# Patient Record
Sex: Female | Born: 1955
Health system: Southern US, Community
[De-identification: ages and names within clinical notes are randomized; demographics above are authoritative.]

## PROBLEM LIST (undated history)

## (undated) DIAGNOSIS — R87619 Unspecified abnormal cytological findings in specimens from cervix uteri: Secondary | ICD-10-CM

## (undated) DIAGNOSIS — A64 Unspecified sexually transmitted disease: Secondary | ICD-10-CM

## (undated) DIAGNOSIS — IMO0002 Reserved for concepts with insufficient information to code with codable children: Secondary | ICD-10-CM

## (undated) HISTORY — DX: Unspecified abnormal cytological findings in specimens from cervix uteri: R87.619

## (undated) HISTORY — DX: Unspecified sexually transmitted disease: A64

## (undated) HISTORY — DX: Reserved for concepts with insufficient information to code with codable children: IMO0002

---

## 2003-08-24 ENCOUNTER — Emergency Department (HOSPITAL_COMMUNITY): Admission: EM | Admit: 2003-08-24 | Discharge: 2003-08-24 | Payer: Self-pay | Admitting: Family Medicine

## 2012-04-06 HISTORY — PX: COLONOSCOPY: SHX174

## 2012-08-24 ENCOUNTER — Encounter (HOSPITAL_BASED_OUTPATIENT_CLINIC_OR_DEPARTMENT_OTHER): Payer: Self-pay | Admitting: *Deleted

## 2012-08-24 ENCOUNTER — Emergency Department (HOSPITAL_BASED_OUTPATIENT_CLINIC_OR_DEPARTMENT_OTHER)
Admission: EM | Admit: 2012-08-24 | Discharge: 2012-08-24 | Disposition: A | Discharge status: Home / Self Care | Payer: 59 | Attending: Emergency Medicine | Admitting: Emergency Medicine

## 2012-08-24 ENCOUNTER — Emergency Department (HOSPITAL_BASED_OUTPATIENT_CLINIC_OR_DEPARTMENT_OTHER): Payer: 59

## 2012-08-24 DIAGNOSIS — R51 Headache: Secondary | ICD-10-CM

## 2012-08-24 DIAGNOSIS — J3489 Other specified disorders of nose and nasal sinuses: Secondary | ICD-10-CM | POA: Insufficient documentation

## 2012-08-24 MED ORDER — METOCLOPRAMIDE HCL 10 MG PO TABS: 10.0000 mg | ORAL_TABLET | Freq: Four times a day (QID) | ORAL | Status: DC | PRN

## 2012-08-24 MED ORDER — IBUPROFEN 600 MG PO TABS: 600.0000 mg | ORAL_TABLET | Freq: Four times a day (QID) | ORAL | Status: DC | PRN

## 2012-08-24 MED ORDER — HYDROCODONE-ACETAMINOPHEN 5-325 MG PO TABS
1.0000 | ORAL_TABLET | Freq: Once | ORAL | Status: AC
  Administered 2012-08-24: 1 via ORAL
  Filled 2012-08-24: qty 1

## 2012-08-24 MED ORDER — HYDROCODONE-ACETAMINOPHEN 5-325 MG PO TABS: 2.0000 | ORAL_TABLET | ORAL | Status: DC | PRN

## 2012-08-24 MED ORDER — METOCLOPRAMIDE HCL 10 MG PO TABS
10.0000 mg | ORAL_TABLET | Freq: Once | ORAL | Status: AC
  Administered 2012-08-24: 10 mg via ORAL
  Filled 2012-08-24: qty 1

## 2012-08-24 MED ORDER — IBUPROFEN 400 MG PO TABS
600.0000 mg | ORAL_TABLET | Freq: Once | ORAL | Status: AC
  Administered 2012-08-24: 600 mg via ORAL
  Filled 2012-08-24: qty 1

## 2012-08-24 NOTE — ED Notes (Signed)
Pt c/o " migraine"  X 6 days

## 2012-08-24 NOTE — ED Notes (Signed)
Patient transported to CT 

## 2012-08-25 NOTE — ED Provider Notes (Signed)
History     CSN: [id number]  Arrival date & time 08/24/12  1940   First MD Initiated Contact with Patient 08/24/12 2043      Chief Complaint  Patient presents with  . Migraine    (Consider location/radiation/quality/duration/timing/severity/associated sxs/prior treatment) HPI Pt with 6 days of band like pain around head described as constricting. Gradual onset. No fever chills, photophobia, N/V, focal weakness, numbness, visual changes. +nasal congestion. Pt does not have a history of migraines or similar headaches.  History reviewed. No pertinent past medical history.  History reviewed. No pertinent past surgical history.  History reviewed. No pertinent family history.  History  Substance Use Topics  . Smoking status: Never Smoker   . Smokeless tobacco: Not on file  . Alcohol Use: No    OB History   Grav Para Term Preterm Abortions TAB SAB Ect Mult Living                  Review of Systems  Constitutional: Negative for fever and chills.  HENT: Positive for congestion. Negative for sore throat, neck pain, neck stiffness and sinus pressure.   Eyes: Negative for photophobia and visual disturbance.  Respiratory: Negative for shortness of breath.   Cardiovascular: Negative for chest pain.  Gastrointestinal: Negative for nausea, vomiting and abdominal pain.  Musculoskeletal: Negative for myalgias.  Skin: Negative for rash and wound.  Neurological: Positive for headaches. Negative for dizziness, syncope, weakness, light-headedness and numbness.  All other systems reviewed and are negative.    Allergies  Review of patient's allergies indicates no known allergies.  Home Medications   Current Outpatient Rx  Name  Route  Sig  Dispense  Refill  . HYDROcodone-acetaminophen (NORCO) 5-325 MG per tablet   Oral   Take 2 tablets by mouth every 4 (four) hours as needed for pain.   10 tablet   0   . ibuprofen (ADVIL,MOTRIN) 600 MG tablet   Oral   Take 1 tablet (600 mg  total) by mouth every 6 (six) hours as needed for pain.   30 tablet   0   . metoCLOPramide (REGLAN) 10 MG tablet   Oral   Take 1 tablet (10 mg total) by mouth every 6 (six) hours as needed (nausea/headache).   6 tablet   0     BP 117/69  Pulse 65  Temp(Src) 98.7 F (37.1 C) (Oral)  Resp 18  Ht 5\' 4"  (1.626 m)  Wt 138 lb (62.596 kg)  BMI 23.68 kg/m2  SpO2 99%  Physical Exam  Nursing note and vitals reviewed. Constitutional: She is oriented to person, place, and time. She appears well-developed and well-nourished. No distress.  HENT:  Head: Normocephalic and atraumatic.  Mouth/Throat: Oropharynx is clear and moist.  No sinus tenderness  Eyes: EOM are normal. Pupils are equal, round, and reactive to light.  Neck: Normal range of motion. Neck supple.  No meningismus. L SCM and trapezius TTP  Cardiovascular: Normal rate and regular rhythm.   Pulmonary/Chest: Effort normal and breath sounds normal. No respiratory distress. She has no wheezes. She has no rales.  Abdominal: Soft. Bowel sounds are normal. She exhibits no distension. There is no tenderness. There is no rebound and no guarding.  Musculoskeletal: Normal range of motion. She exhibits no edema and no tenderness.  Neurological: She is alert and oriented to person, place, and time.  5/5 motor in all ext, sensation intact, finger to nose intact.   Skin: Skin is warm and dry. No rash  noted. No erythema.  Psychiatric: She has a normal mood and affect. Her behavior is normal.    ED Course  Procedures (including critical care time)  Labs Reviewed - No data to display Ct Head Wo Contrast  08/24/2012   *RADIOLOGY REPORT*  Clinical Data:  Severe headache for past 6 days.  Left facial pain.  CT HEAD WITHOUT CONTRAST CT MAXILLOFACIAL WITHOUT CONTRAST  Technique:  Multidetector CT imaging of the head and maxillofacial structures were performed using the standard protocol without intravenous contrast. Multiplanar CT image  reconstructions of the maxillofacial structures were also generated.  Comparison:   None.  CT HEAD  Findings: There is no evidence of intracranial hemorrhage, brain edema or other signs of acute infarction.  There is no evidence of intracranial mass lesion or mass effect.  No abnormal extra-axial fluid collections are identified.  Ventricles are normal in size.  No skull abnormality identified.  IMPRESSION: Negative noncontrast head CT.  CT MAXILLOFACIAL  Findings:   No evidence of orbital or facial bone fracture.  Globes and other intraorbital anatomy are normal in appearance.  No evidence of orbital emphysema or sinus air fluid levels. No soft tissue mass or inflammatory process identified.  IMPRESSION: Negative.  No evidence of fracture or other significant abnormality.   Original Report Authenticated By: Myles Rosenthal, M.D.   Ct Maxillofacial Wo Cm  08/24/2012   *RADIOLOGY REPORT*  Clinical Data:  Severe headache for past 6 days.  Left facial pain.  CT HEAD WITHOUT CONTRAST CT MAXILLOFACIAL WITHOUT CONTRAST  Technique:  Multidetector CT imaging of the head and maxillofacial structures were performed using the standard protocol without intravenous contrast. Multiplanar CT image reconstructions of the maxillofacial structures were also generated.  Comparison:   None.  CT HEAD  Findings: There is no evidence of intracranial hemorrhage, brain edema or other signs of acute infarction.  There is no evidence of intracranial mass lesion or mass effect.  No abnormal extra-axial fluid collections are identified.  Ventricles are normal in size.  No skull abnormality identified.  IMPRESSION: Negative noncontrast head CT.  CT MAXILLOFACIAL  Findings:   No evidence of orbital or facial bone fracture.  Globes and other intraorbital anatomy are normal in appearance.  No evidence of orbital emphysema or sinus air fluid levels. No soft tissue mass or inflammatory process identified.  IMPRESSION: Negative.  No evidence of fracture  or other significant abnormality.   Original Report Authenticated By: Myles Rosenthal, M.D.     1. Headache       MDM  Pt states she is feeling much better after meds. Normal CT. Pt has features consistent with tension HA. Do not suspect SAH or infectious process. Pt advised to f/u with PMD for neurology referral for persistent HA. Return precautions given        Loren Racer, MD 08/25/12 [phone]

## 2013-01-16 ENCOUNTER — Encounter: Payer: Self-pay | Admitting: Certified Nurse Midwife

## 2013-01-18 ENCOUNTER — Ambulatory Visit: Payer: Self-pay | Admitting: Certified Nurse Midwife

## 2013-02-14 ENCOUNTER — Encounter: Payer: Self-pay | Admitting: Certified Nurse Midwife

## 2013-02-14 ENCOUNTER — Ambulatory Visit (INDEPENDENT_AMBULATORY_CARE_PROVIDER_SITE_OTHER): Payer: 59 | Admitting: Certified Nurse Midwife

## 2013-02-14 VITALS — BP 134/84 | HR 60 | Resp 16 | Ht 63.75 in | Wt 144.8 lb

## 2013-02-14 DIAGNOSIS — A6000 Herpesviral infection of urogenital system, unspecified: Secondary | ICD-10-CM

## 2013-02-14 DIAGNOSIS — N951 Menopausal and female climacteric states: Secondary | ICD-10-CM

## 2013-02-14 DIAGNOSIS — Z0000 Encounter for general adult medical examination without abnormal findings: Secondary | ICD-10-CM

## 2013-02-14 DIAGNOSIS — Z01419 Encounter for gynecological examination (general) (routine) without abnormal findings: Secondary | ICD-10-CM

## 2013-02-14 DIAGNOSIS — A6009 Herpesviral infection of other urogenital tract: Secondary | ICD-10-CM

## 2013-02-14 LAB — POCT URINALYSIS DIPSTICK
Ketones, UA: NEGATIVE
Leukocytes, UA: NEGATIVE
Nitrite, UA: NEGATIVE
Protein, UA: NEGATIVE
pH, UA: 5

## 2013-02-14 MED ORDER — VALACYCLOVIR HCL 500 MG PO TABS: 500.0000 mg | ORAL_TABLET | Freq: Two times a day (BID) | ORAL | Status: DC

## 2013-02-14 NOTE — Patient Instructions (Signed)

## 2013-02-14 NOTE — Progress Notes (Signed)
57 y.o. [id number] MarriedAfrican AmericanF here for annual exam. Periods normal from 10/13-12/13. No period in 1/14, but 2/14 normal. No period since 2/14.Patient is having hot flashes and some night sweats. No insomnia issues.No vaginal dryness or other health issues. Was evaluated for headache with CT scan, all negative. Patient had 4 HSV outbreak in last year and has taken no medication for her choice, but has noticed they are lasting longer and is interested in medication now. No other health issues.  Patient's last menstrual period was 05/23/2012.          Sexually active: no  The current method of family planning is none.    Exercising: no  not regularly Smoker:  no  Health Maintenance: Pap:  01/18/12 WNL/negative HR HPV History of abnormal Pap:  yes MMG:  2008-normal Colonoscopy:  4/14 repeat in 10 years BMD:   none TDaP:  2013 Screening Labs: , Hb today: 11.9, Urine today: negative   reports that she has never smoked. She has never used smokeless tobacco. She reports that she drinks alcohol. She reports that she does not use illicit drugs.  Past Medical History  Diagnosis Date  . Abnormal Pap smear     f/u pap only  . STD (sexually transmitted disease)     HSV 2    Past Surgical History  Procedure Laterality Date  . Colonoscopy  2014    No current outpatient prescriptions on file.   No current facility-administered medications for this visit.    Family History  Problem Relation Age of Onset  . Other Father     extra digits  . Other Sister     extra digits  . Diabetes Brother     ROS:  Pertinent items are noted in HPI.  Otherwise, a comprehensive ROS was negative.  Exam:   BP 134/84  Pulse 60  Resp 16  Ht 5' 3.75" (1.619 m)  Wt 144 lb 12.8 oz (65.681 kg)  BMI 25.06 kg/m2  LMP 05/23/2012   Height: 5' 3.75" (161.9 cm)  Ht Readings from Last 3 Encounters:  02/14/13 5' 3.75" (1.619 m)  08/24/12 5\' 4"  (1.626 m)    General appearance: alert, cooperative and  appears stated age Head: Normocephalic, without obvious abnormality, atraumatic Neck: no adenopathy, supple, symmetrical, trachea midline and thyroid normal to inspection and palpation Lungs: clear to auscultation bilaterally Breasts: normal appearance, no masses or tenderness, No nipple retraction or dimpling, No nipple discharge or bleeding, No axillary or supraclavicular adenopathy Heart: regular rate and rhythm Abdomen: soft, non-tender; bowel sounds normal; no masses,  no organomegaly Extremities: extremities normal, atraumatic, no cyanosis or edema Skin: Skin color, texture, turgor normal. No rashes or lesions Lymph nodes: Cervical, supraclavicular, and axillary nodes normal. No abnormal inguinal nodes palpated Neurologic: Grossly normal   Pelvic: External genitalia:  no lesions              Urethra:  normal appearing urethra with no masses, tenderness or lesions              Bartholins and Skenes: normal                 Vagina: normal appearing vagina with normal color and discharge, no lesions              Cervix: normal, non tender              Pap taken: no Bimanual Exam:  Uterus:  normal size, contour, position, consistency, mobility, non-tender and  mid position              Adnexa: normal adnexa and no mass, fullness, tenderness               Rectovaginal: Confirms               Anus:  normal sphincter tone, no lesions  A:  Well Woman with normal exam  Menopausal symptoms with amenorrhea  HSV11 desires medication for outbreaks  P:   Reviewed health and wellness pertinent to exam  Discussed menopausal expectations and menopausal when no period for one year. May need provera challenge, patient agreeable if needed. Lab FSH, TSH  Menopausal handout given  Discussed treatment for HSV11 outbreaks to limit course. Patient desires.  Rx Valtrex see order  Mammogram yearly, stressed scheduling pap smear not taken today  counseled on breast self exam, mammography screening,  menopause, adequate intake of calcium and vitamin D, diet and exercise   return annually or prn  An After Visit Summary was printed and given to the patient.

## 2013-02-16 ENCOUNTER — Telehealth: Payer: Self-pay

## 2013-02-16 ENCOUNTER — Other Ambulatory Visit: Payer: Self-pay | Admitting: Certified Nurse Midwife

## 2013-02-16 DIAGNOSIS — N951 Menopausal and female climacteric states: Secondary | ICD-10-CM

## 2013-02-16 MED ORDER — MEDROXYPROGESTERONE ACETATE 10 MG PO TABS: 10.0000 mg | ORAL_TABLET | Freq: Every day | ORAL | Status: DC

## 2013-02-16 NOTE — Progress Notes (Signed)
Note reviewed, agree with plan.  Jovonni Borquez, MD  

## 2013-02-16 NOTE — Telephone Encounter (Signed)
Message copied by Eliezer Bottom on Thu Feb 16, 2013 10:29 AM ------      Message from: Verner Chol      Created: Thu Feb 16, 2013  8:26 AM       Notify patient that TSH is normal      FSH is reflective of menopausal range, may or may not have any more bleeding, but needs Provera challenge as we discussed. Order in for Provera 10 mg      Instruct patient to call after 14 days last day of Provera with the results of bleeding or no bleeding. ------

## 2013-02-16 NOTE — Telephone Encounter (Signed)
lmtcb

## 2013-02-20 NOTE — Telephone Encounter (Signed)
Left message for call back.

## 2013-02-22 NOTE — Telephone Encounter (Signed)
Left message for call back.

## 2013-02-24 NOTE — Telephone Encounter (Signed)
Returning call.

## 2013-02-27 NOTE — Telephone Encounter (Signed)
Left message for call back.

## 2013-03-01 NOTE — Telephone Encounter (Signed)
Left message for call back.

## 2013-03-06 NOTE — Telephone Encounter (Signed)
Patient is trying to call joy back °

## 2013-03-07 NOTE — Telephone Encounter (Signed)
Left message for call back.

## 2013-03-08 NOTE — Telephone Encounter (Signed)
Patient is calling Joy back °

## 2013-03-08 NOTE — Telephone Encounter (Signed)
Patient notified of results.

## 2014-02-05 ENCOUNTER — Encounter: Payer: Self-pay | Admitting: Certified Nurse Midwife

## 2014-02-21 ENCOUNTER — Ambulatory Visit: Payer: 59 | Admitting: Certified Nurse Midwife

## 2014-02-21 ENCOUNTER — Encounter: Payer: Self-pay | Admitting: Certified Nurse Midwife

## 2014-12-07 ENCOUNTER — Encounter: Payer: Self-pay | Admitting: Certified Nurse Midwife

## 2015-02-06 ENCOUNTER — Ambulatory Visit (INDEPENDENT_AMBULATORY_CARE_PROVIDER_SITE_OTHER): Payer: 59 | Admitting: Certified Nurse Midwife

## 2015-02-06 ENCOUNTER — Encounter: Payer: Self-pay | Admitting: Certified Nurse Midwife

## 2015-02-06 VITALS — BP 108/70 | HR 72 | Resp 16 | Ht 63.75 in | Wt 144.0 lb

## 2015-02-06 DIAGNOSIS — Z124 Encounter for screening for malignant neoplasm of cervix: Secondary | ICD-10-CM | POA: Diagnosis not present

## 2015-02-06 NOTE — Progress Notes (Signed)
59 y.o. [id number] Married  Caucasian Fe here for annual exam. Menopausal no HRT still having night sweats and hot flashes, no insomnia issues denies vaginal bleeding or vaginal dryness.Sees PCP yearly for labs/aex with  Dr. Joseph Art. Busy with work and family, no health issues today.  Patient's last menstrual period was 05/23/2012.          Sexually active: No.  The current method of family planning is menopausal.    Exercising: No.  exercise Smoker:  no  Health Maintenance: Pap:  01-18-12 neg HPV HR neg MMG: 2008 neg over due Colonoscopy:  4/14 f/u 57yrs BMD:   None TDaP:  2013 Labs: none Self breast exam: done occ   reports that she has never smoked. She has never used smokeless tobacco. She reports that she drinks alcohol. She reports that she does not use illicit drugs.  Past Medical History  Diagnosis Date  . Abnormal Pap smear     f/u pap only  . STD (sexually transmitted disease)     HSV 2    Past Surgical History  Procedure Laterality Date  . Colonoscopy  2014    No current outpatient prescriptions on file.   No current facility-administered medications for this visit.    Family History  Problem Relation Age of Onset  . Other Father     extra digits  . Other Sister     extra digits  . Diabetes Brother     ROS:  Pertinent items are noted in HPI.  Otherwise, a comprehensive ROS was negative.  Exam:   BP 108/70 mmHg  Pulse 72  Resp 16  Ht 5' 3.75" (1.619 m)  Wt 144 lb (65.318 kg)  BMI 24.92 kg/m2  LMP 05/23/2012 Height: 5' 3.75" (161.9 cm) Ht Readings from Last 3 Encounters:  02/06/15 5' 3.75" (1.619 m)  02/14/13 5' 3.75" (1.619 m)  08/24/12  (1.626 m)    General appearance: alert, cooperative and appears stated age Head: Normocephalic, without obvious abnormality, atraumatic Neck: no adenopathy, supple, symmetrical, trachea midline and thyroid normal to inspection and palpation Lungs: clear to auscultation bilaterally Breasts: normal appearance,  no masses or tenderness, No nipple retraction or dimpling, No nipple discharge or bleeding, No axillary or supraclavicular adenopathy Heart: regular rate and rhythm Abdomen: soft, non-tender; no masses,  no organomegaly Extremities: extremities normal, atraumatic, no cyanosis or edema Skin: Skin color, texture, turgor normal. No rashes or lesions Lymph nodes: Cervical, supraclavicular, and axillary nodes normal. No abnormal inguinal nodes palpated Neurologic: Grossly normal   Pelvic: External genitalia:  no lesions              Urethra:  normal appearing urethra with no masses, tenderness or lesions              Bartholin's and Skene's: normal                 Vagina: normal appearing vagina with normal color and discharge, no lesions              Cervix: normal, non tender, no lesions              Pap taken: Yes.   Bimanual Exam:    Uterus:  normal size, contour, position, consistency, mobility, non-tender              Adnexa:35 normal adnexa and no mass, fullness, tenderness               Rectovaginal: Confirms  Anus:  normal sphincter tone, no lesions  Chaperone present: yes  A:  Well Woman with normal exam  Menopausal  No HRT  Mammogram due  P:   Reviewed health and wellness pertinent to exam  Aware of need to evaluate if vaginal bleeding  Will schedule prior to leaving  Pap smear taken with HPV reflex   counseled on breast self exam, mammography screening, adequate intake of calcium and vitamin D, diet and exercise  return annually or prn  An After Visit Summary was printed and given to the patient.

## 2015-02-06 NOTE — Patient Instructions (Signed)

## 2015-02-06 NOTE — Progress Notes (Signed)
Scheduled patient for screening mammogram at [facility] for 11/16 at 2:15 pm. Agreeable to date and time.

## 2015-02-07 LAB — IPS PAP TEST WITH REFLEX TO HPV

## 2015-02-10 NOTE — Progress Notes (Signed)
Encounter reviewed Jill Jertson, MD   

## 2015-10-23 ENCOUNTER — Encounter (HOSPITAL_BASED_OUTPATIENT_CLINIC_OR_DEPARTMENT_OTHER): Payer: Self-pay

## 2015-10-23 ENCOUNTER — Emergency Department (HOSPITAL_BASED_OUTPATIENT_CLINIC_OR_DEPARTMENT_OTHER): Payer: No Typology Code available for payment source

## 2015-10-23 ENCOUNTER — Emergency Department (HOSPITAL_BASED_OUTPATIENT_CLINIC_OR_DEPARTMENT_OTHER)
Admission: EM | Admit: 2015-10-23 | Discharge: 2015-10-23 | Disposition: A | Discharge status: Home / Self Care | Payer: No Typology Code available for payment source | Attending: Emergency Medicine | Admitting: Emergency Medicine

## 2015-10-23 DIAGNOSIS — Z79899 Other long term (current) drug therapy: Secondary | ICD-10-CM | POA: Diagnosis not present

## 2015-10-23 DIAGNOSIS — S161XXA Strain of muscle, fascia and tendon at neck level, initial encounter: Secondary | ICD-10-CM

## 2015-10-23 DIAGNOSIS — Y92410 Unspecified street and highway as the place of occurrence of the external cause: Secondary | ICD-10-CM | POA: Diagnosis not present

## 2015-10-23 DIAGNOSIS — S20219A Contusion of unspecified front wall of thorax, initial encounter: Secondary | ICD-10-CM

## 2015-10-23 DIAGNOSIS — Y999 Unspecified external cause status: Secondary | ICD-10-CM | POA: Insufficient documentation

## 2015-10-23 DIAGNOSIS — R110 Nausea: Secondary | ICD-10-CM | POA: Insufficient documentation

## 2015-10-23 DIAGNOSIS — Y9389 Activity, other specified: Secondary | ICD-10-CM | POA: Insufficient documentation

## 2015-10-23 DIAGNOSIS — S199XXA Unspecified injury of neck, initial encounter: Secondary | ICD-10-CM | POA: Diagnosis present

## 2015-10-23 LAB — URINALYSIS, ROUTINE W REFLEX MICROSCOPIC
BILIRUBIN URINE: NEGATIVE
Glucose, UA: NEGATIVE mg/dL
HGB URINE DIPSTICK: NEGATIVE
Ketones, ur: 15 mg/dL — AB
Nitrite: NEGATIVE
PH: 7 (ref 5.0–8.0)
Protein, ur: NEGATIVE mg/dL
SPECIFIC GRAVITY, URINE: 1.014 (ref 1.005–1.030)

## 2015-10-23 LAB — URINE MICROSCOPIC-ADD ON: BACTERIA UA: NONE SEEN

## 2015-10-23 MED ORDER — IBUPROFEN 800 MG PO TABS
800.0000 mg | ORAL_TABLET | Freq: Once | ORAL | Status: AC
  Administered 2015-10-23: 800 mg via ORAL
  Filled 2015-10-23: qty 1

## 2015-10-23 MED ORDER — METHOCARBAMOL 500 MG PO TABS: 500.0000 mg | ORAL_TABLET | Freq: Two times a day (BID) | ORAL | Status: DC

## 2015-10-23 MED ORDER — NAPROXEN 500 MG PO TABS: 500.0000 mg | ORAL_TABLET | Freq: Two times a day (BID) | ORAL | Status: DC

## 2015-10-23 MED ORDER — CYCLOBENZAPRINE HCL 10 MG PO TABS
10.0000 mg | ORAL_TABLET | Freq: Once | ORAL | Status: AC
  Administered 2015-10-23: 10 mg via ORAL
  Filled 2015-10-23: qty 1

## 2015-10-23 NOTE — Discharge Instructions (Signed)
Cervical Sprain °A cervical sprain is an injury in the neck in which the strong, fibrous tissues (ligaments) that connect your neck bones stretch or tear. Cervical sprains can range from mild to severe. Severe cervical sprains can cause the neck vertebrae to be unstable. This can lead to damage of the spinal cord and can result in serious nervous system problems. The amount of time it takes for a cervical sprain to get better depends on the cause and extent of the injury. Most cervical sprains heal in 1 to 3 weeks. °CAUSES  °Severe cervical sprains may be caused by:  °· Contact sport injuries (such as from football, rugby, wrestling, hockey, auto racing, gymnastics, diving, martial arts, or boxing).   °· Motor vehicle collisions.   °· Whiplash injuries. This is an injury from a sudden forward and backward whipping movement of the head and neck.  °· Falls.   °Mild cervical sprains may be caused by:  °· Being in an awkward position, such as while cradling a telephone between your ear and shoulder.   °· Sitting in a chair that does not offer proper support.   °· Working at a poorly designed computer station.   °· Looking up or down for long periods of time.   °SYMPTOMS  °· Pain, soreness, stiffness, or a burning sensation in the front, back, or sides of the neck. This discomfort may develop immediately after the injury or slowly, 24 hours or more after the injury.   °· Pain or tenderness directly in the middle of the back of the neck.   °· Shoulder or upper back pain.   °· Limited ability to move the neck.   °· Headache.   °· Dizziness.   °· Weakness, numbness, or tingling in the hands or arms.   °· Muscle spasms.   °· Difficulty swallowing or chewing.   °· Tenderness and swelling of the neck.   °DIAGNOSIS  °Most of the time your health care provider can diagnose a cervical sprain by taking your history and doing a physical exam. Your health care provider will ask about previous neck injuries and any known neck  problems, such as arthritis in the neck. X-rays may be taken to find out if there are any other problems, such as with the bones of the neck. Other tests, such as a CT scan or MRI, may also be needed.  °TREATMENT  °Treatment depends on the severity of the cervical sprain. Mild sprains can be treated with rest, keeping the neck in place (immobilization), and pain medicines. Severe cervical sprains are immediately immobilized. Further treatment is done to help with pain, muscle spasms, and other symptoms and may include: °· Medicines, such as pain relievers, numbing medicines, or muscle relaxants.   °· Physical therapy. This may involve stretching exercises, strengthening exercises, and posture training. Exercises and improved posture can help stabilize the neck, strengthen muscles, and help stop symptoms from returning.   °HOME CARE INSTRUCTIONS  °· Put ice on the injured area.   °¨ Put ice in a plastic bag.   °¨ Place a towel between your skin and the bag.   °¨ Leave the ice on for 15-20 minutes, 3-4 times a day.   °· If your injury was severe, you may have been given a cervical collar to wear. A cervical collar is a two-piece collar designed to keep your neck from moving while it heals. °¨ Do not remove the collar unless instructed by your health care provider. °¨ If you have long hair, keep it outside of the collar. °¨ Ask your health care provider before making any adjustments to your collar. Minor   adjustments may be required over time to improve comfort and reduce pressure on your chin or on the back of your head. °¨ If you are allowed to remove the collar for cleaning or bathing, follow your health care provider's instructions on how to do so safely. °¨ Keep your collar clean by wiping it with mild soap and water and drying it completely. If the collar you have been given includes removable pads, remove them every 1-2 days and hand wash them with soap and water. Allow them to air dry. They should be completely  dry before you wear them in the collar. °¨ If you are allowed to remove the collar for cleaning and bathing, wash and dry the skin of your neck. Check your skin for irritation or sores. If you see any, tell your health care provider. °¨ Do not drive while wearing the collar.   °· Only take over-the-counter or prescription medicines for pain, discomfort, or fever as directed by your health care provider.   °· Keep all follow-up appointments as directed by your health care provider.   °· Keep all physical therapy appointments as directed by your health care provider.   °· Make any needed adjustments to your workstation to promote good posture.   °· Avoid positions and activities that make your symptoms worse.   °· Warm up and stretch before being active to help prevent problems.   °SEEK MEDICAL CARE IF:  °· Your pain is not controlled with medicine.   °· You are unable to decrease your pain medicine over time as planned.   °· Your activity level is not improving as expected.   °SEEK IMMEDIATE MEDICAL CARE IF:  °· You develop any bleeding. °· You develop stomach upset. °· You have signs of an allergic reaction to your medicine.   °· Your symptoms get worse.   °· You develop new, unexplained symptoms.   °· You have numbness, tingling, weakness, or paralysis in any part of your body.   °MAKE SURE YOU:  °· Understand these instructions. °· Will watch your condition. °· Will get help right away if you are not doing well or get worse. °  °This information is not intended to replace advice given to you by your health care provider. Make sure you discuss any questions you have with your health care provider. °  °Document Released: 01/18/2007 Document Revised: 03/28/2013 Document Reviewed: 09/28/2012 °Elsevier Interactive Patient Education ©2016 [employer] ° °Chest Contusion °A chest contusion is a deep bruise on your chest area. Contusions are the result of an injury that caused bleeding under the skin. A chest contusion  may involve bruising of the skin, muscles, or ribs. The contusion may turn blue, purple, or yellow. Minor injuries will give you a painless contusion, but more severe contusions may stay painful and swollen for a few weeks. °CAUSES  °A contusion is usually caused by a blow, trauma, or direct force to an area of the body. °SYMPTOMS  °· Swelling and redness of the injured area. °· Discoloration of the injured area. °· Tenderness and soreness of the injured area. °· Pain. °DIAGNOSIS  °The diagnosis can be made by taking a history and performing a physical exam. An X-ray, CT scan, or MRI may be needed to determine if there were any associated injuries, such as broken bones (fractures) or internal injuries. °TREATMENT  °Often, the best treatment for a chest contusion is resting, icing, and applying cold compresses to the injured area. Deep breathing exercises may be recommended to reduce the risk of pneumonia. Over-the-counter medicines may also be recommended   for pain control. °HOME CARE INSTRUCTIONS  °· Put ice on the injured area. °¨ Put ice in a plastic bag. °¨ Place a towel between your skin and the bag. °¨ Leave the ice on for 15-20 minutes, 03-04 times a day. °· Only take over-the-counter or prescription medicines as directed by your caregiver. Your caregiver may recommend avoiding anti-inflammatory medicines (aspirin, ibuprofen, and naproxen) for 48 hours because these medicines may increase bruising. °· Rest the injured area. °· Perform deep-breathing exercises as directed by your caregiver. °· Stop smoking if you smoke. °· Do not lift objects over 5 pounds (2.3 kg) for 3 days or longer if recommended by your caregiver. °SEEK IMMEDIATE MEDICAL CARE IF:  °· You have increased bruising or swelling. °· You have pain that is getting worse. °· You have difficulty breathing. °· You have dizziness, weakness, or fainting. °· You have blood in your urine or stool. °· You cough up or vomit blood. °· Your swelling or pain  is not relieved with medicines. °MAKE SURE YOU:  °· Understand these instructions. °· Will watch your condition. °· Will get help right away if you are not doing well or get worse. °  °This information is not intended to replace advice given to you by your health care provider. Make sure you discuss any questions you have with your health care provider. °  °Document Released: 12/16/2000 Document Revised: 12/16/2011 Document Reviewed: 09/14/2011 °Elsevier Interactive Patient Education ©2016 [employer] ° °
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
## 2015-10-23 NOTE — ED Notes (Signed)
Pt teaching provided on medications that may cause drowsiness. Pt instructed not to drive or operate heavy machinery while taking the prescribed medication. Pt verbalized understanding.   
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
## 2016-02-12 ENCOUNTER — Ambulatory Visit (INDEPENDENT_AMBULATORY_CARE_PROVIDER_SITE_OTHER): Payer: 59 | Admitting: Certified Nurse Midwife

## 2016-02-12 ENCOUNTER — Encounter: Payer: Self-pay | Admitting: Certified Nurse Midwife

## 2016-02-12 VITALS — BP 120/82 | HR 74 | Resp 16 | Ht 63.75 in | Wt 143.0 lb

## 2016-02-12 DIAGNOSIS — Z01419 Encounter for gynecological examination (general) (routine) without abnormal findings: Secondary | ICD-10-CM | POA: Diagnosis not present

## 2016-02-12 DIAGNOSIS — Z0000 Encounter for general adult medical examination without abnormal findings: Secondary | ICD-10-CM | POA: Diagnosis not present

## 2016-02-12 DIAGNOSIS — N951 Menopausal and female climacteric states: Secondary | ICD-10-CM | POA: Diagnosis not present

## 2016-02-12 LAB — TSH: TSH: 1.37 m[IU]/L

## 2016-02-12 NOTE — Patient Instructions (Signed)
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
## 2016-02-13 LAB — LIPID PANEL
CHOL/HDL RATIO: 2.6 ratio (ref <5.0)
CHOLESTEROL: 196 mg/dL (ref <200)
HDL: 74 mg/dL (ref >50)
LDL Cholesterol: 108 mg/dL — ABNORMAL HIGH
TRIGLYCERIDES: 71 mg/dL (ref <150)
VLDL: 14 mg/dL (ref <30)

## 2016-02-13 LAB — COMPREHENSIVE METABOLIC PANEL
ALBUMIN: 4.1 g/dL (ref 3.6–5.1)
ALK PHOS: 82 U/L (ref 33–130)
ALT: 16 U/L (ref 6–29)
AST: 21 U/L (ref 10–35)
BILIRUBIN TOTAL: 0.3 mg/dL (ref 0.2–1.2)
BUN: 14 mg/dL (ref 7–25)
CALCIUM: 9 mg/dL (ref 8.6–10.4)
CHLORIDE: 107 mmol/L (ref 98–110)
CO2: 26 mmol/L (ref 20–31)
CREATININE: 0.8 mg/dL (ref 0.50–1.05)
Glucose, Bld: 80 mg/dL (ref 65–99)
POTASSIUM: 4.3 mmol/L (ref 3.5–5.3)
SODIUM: 141 mmol/L (ref 135–146)
TOTAL PROTEIN: 6.8 g/dL (ref 6.1–8.1)

## 2016-02-13 LAB — HEPATITIS C ANTIBODY: HCV Ab: NEGATIVE

## 2016-02-13 LAB — VITAMIN D 25 HYDROXY (VIT D DEFICIENCY, FRACTURES): Vit D, 25-Hydroxy: 32 ng/mL (ref 30–100)

## 2016-02-16 NOTE — Progress Notes (Signed)
Encounter reviewed Toinette Lackie, MD   

## 2016-04-09 ENCOUNTER — Encounter: Payer: Self-pay | Admitting: Certified Nurse Midwife
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
## 2017-02-17 ENCOUNTER — Encounter: Payer: Self-pay | Admitting: Certified Nurse Midwife

## 2017-02-17 ENCOUNTER — Ambulatory Visit: Payer: 59 | Admitting: Certified Nurse Midwife

## 2017-02-17 ENCOUNTER — Other Ambulatory Visit: Payer: Self-pay

## 2017-02-17 VITALS — BP 120/80 | HR 70 | Resp 16 | Ht 63.5 in | Wt 141.0 lb

## 2017-02-17 DIAGNOSIS — N951 Menopausal and female climacteric states: Secondary | ICD-10-CM

## 2017-02-17 DIAGNOSIS — Z01419 Encounter for gynecological examination (general) (routine) without abnormal findings: Secondary | ICD-10-CM

## 2017-02-17 NOTE — Progress Notes (Signed)
61 y.o. [id number] Married  African American Fe here for annual exam.Menopausal no HRT. Denies vaginal bleeding or vaginal dryness. Sees PCP Dr. Chilton SiGreen, yearly and prn. Patient was seen recently due to hair loss and eye brows, all normal. Has appointment with Dermatology. No health issues today. Planning trip to [place] soon.  Patient's last menstrual period was 05/23/2012.          Sexually active: No.  The current method of family planning is post menopausal status.    Exercising: No.  exercise Smoker:  no  Health Maintenance: Pap:  02-06-15 neg History of Abnormal Pap: yes just a repeat done MMG:  04-03-16 category c density birads 1:neg Self Breast exams: occ Colonoscopy:  2014 f/u 5647yrs BMD:   None  TDaP:  2013 Shingles: no Pneumonia: no Hep C and HIV: Hep c neg 2017 Labs: none   reports that  has never smoked. she has never used smokeless tobacco. She reports that she does not drink alcohol or use drugs.  Past Medical History:  Diagnosis Date  . Abnormal Pap smear    f/u pap only  . STD (sexually transmitted disease)    HSV 2    Past Surgical History:  Procedure Laterality Date  . COLONOSCOPY  2014    Current Outpatient Medications  Medication Sig Dispense Refill  . cholecalciferol (VITAMIN D) 1000 units tablet Take 1,000 Units as needed by mouth.     Marland Kitchen. ibuprofen (ADVIL,MOTRIN) 800 MG tablet     . Multiple Vitamin (MULTIVITAMIN) tablet Take 1 tablet as needed by mouth.      No current facility-administered medications for this visit.     Family History  Problem Relation Age of Onset  . Other Father        extra digits  . Other Sister        extra digits  . Diabetes Brother     ROS:  Pertinent items are noted in HPI.  Otherwise, a comprehensive ROS was negative.  Exam:   BP 120/80   Pulse 70   Resp 16   Ht 5' 3.5" (1.613 m)   Wt 141 lb (64 kg)   LMP 05/23/2012   BMI 24.59 kg/m  Height: 5' 3.5" (161.3 cm) Ht Readings from Last 3 Encounters:   02/17/17 5' 3.5" (1.613 m)  02/12/16 5' 3.75" (1.619 m)  10/23/15 5\' 5"  (1.651 m)    General appearance: alert, cooperative and appears stated age Head: Normocephalic, without obvious abnormality, atraumatic Neck: no adenopathy, supple, symmetrical, trachea midline and thyroid normal to inspection and palpation Lungs: clear to auscultation bilaterally Breasts: normal appearance, no masses or tenderness, No nipple retraction or dimpling, No nipple discharge or bleeding, No axillary or supraclavicular adenopathy Heart: regular rate and rhythm Abdomen: soft, non-tender; no masses,  no organomegaly Extremities: extremities normal, atraumatic, no cyanosis or edema Skin: Skin color, texture, turgor normal. No rashes or lesions Lymph nodes: Cervical, supraclavicular, and axillary nodes normal. No abnormal inguinal nodes palpated Neurologic: Grossly normal   Pelvic: External genitalia:  no lesions              Urethra:  normal appearing urethra with no masses, tenderness or lesions              Bartholin's and Skene's: normal                 Vagina: normal appearing vagina with normal color and discharge, no lesions  Cervix: no bleeding following Pap, no cervical motion tenderness and no lesions              Pap taken: No. Bimanual Exam:  Uterus:  normal size, contour, position, consistency, mobility, non-tender              Adnexa: normal adnexa and no mass, fullness, tenderness               Rectovaginal: Confirms               Anus:  normal sphincter tone, no lesions  Chaperone present: yes  A:  Well Woman with normal exam  Menopausal no HRT  Hair loss with dermatology appointment schedule  P:   Reviewed health and wellness pertinent to exam  Aware of need of to advise if vaginal bleeding  Continue follow up as indicated with PCP  Pap smear: no   counseled on breast self exam, mammography screening, menopause, adequate intake of calcium and vitamin D, diet and  exercise  return annually or prn  An After Visit Summary was printed and given to the patient.

## 2017-02-17 NOTE — Patient Instructions (Signed)

## 2017-09-09 DIAGNOSIS — L659 Nonscarring hair loss, unspecified: Secondary | ICD-10-CM | POA: Insufficient documentation

## 2017-09-09 DIAGNOSIS — L819 Disorder of pigmentation, unspecified: Secondary | ICD-10-CM | POA: Insufficient documentation

## 2017-09-09 DIAGNOSIS — L249 Irritant contact dermatitis, unspecified cause: Secondary | ICD-10-CM | POA: Insufficient documentation

## 2018-02-07 DIAGNOSIS — Z1231 Encounter for screening mammogram for malignant neoplasm of breast: Secondary | ICD-10-CM | POA: Diagnosis not present

## 2018-02-23 ENCOUNTER — Other Ambulatory Visit: Payer: Self-pay

## 2018-02-23 ENCOUNTER — Ambulatory Visit: Payer: 59 | Admitting: Certified Nurse Midwife

## 2018-02-23 ENCOUNTER — Ambulatory Visit (INDEPENDENT_AMBULATORY_CARE_PROVIDER_SITE_OTHER): Payer: BLUE CROSS/BLUE SHIELD | Admitting: Certified Nurse Midwife

## 2018-02-23 ENCOUNTER — Encounter: Payer: Self-pay | Admitting: Certified Nurse Midwife

## 2018-02-23 ENCOUNTER — Other Ambulatory Visit (HOSPITAL_COMMUNITY)
Admission: RE | Admit: 2018-02-23 | Discharge: 2018-02-23 | Disposition: A | Discharge status: Home / Self Care | Payer: BLUE CROSS/BLUE SHIELD | Source: Ambulatory Visit | Attending: Obstetrics & Gynecology | Admitting: Obstetrics & Gynecology

## 2018-02-23 VITALS — BP 120/80 | HR 68 | Resp 16 | Ht 63.75 in | Wt 151.0 lb

## 2018-02-23 DIAGNOSIS — Z124 Encounter for screening for malignant neoplasm of cervix: Secondary | ICD-10-CM

## 2018-02-23 DIAGNOSIS — Z780 Asymptomatic menopausal state: Secondary | ICD-10-CM | POA: Diagnosis not present

## 2018-02-23 DIAGNOSIS — Z01419 Encounter for gynecological examination (general) (routine) without abnormal findings: Secondary | ICD-10-CM

## 2018-02-23 NOTE — Patient Instructions (Signed)

## 2018-02-23 NOTE — Progress Notes (Signed)
62 y.o. [id number] Married  African American Fe here for annual exam.  Menopausal denies vaginal bleeding or vaginal dryness. Occasional hot flashes, no night sweats. Denies vaginal dryness or vaginal bleeding. Did not see PCP this year, plans a visit in 2020. No health issues today. Had mammogram, but no report yet, will request.   Patient's last menstrual period was 05/23/2012.          Sexually active: No.  The current method of family planning is post menopausal status.    Exercising: No.  exercise Smoker:  no  Review of Systems  Constitutional: Negative.   HENT: Negative.   Eyes: Negative.   Respiratory: Negative.   Cardiovascular: Negative.   Gastrointestinal: Negative.   Genitourinary: Negative.   Musculoskeletal: Negative.   Skin: Negative.   Neurological: Negative.   Endo/Heme/Allergies: Negative.   Psychiatric/Behavioral: Negative.     Health Maintenance: Pap:  02-06-15 neg History of Abnormal Pap: yes just a repeat done MMG:  Done 2 weeks ago, normal Self Breast exams: yes Colonoscopy:  2014 f/u 65yrs BMD:   none TDaP:  2013 Shingles: no Pneumonia: no Hep C and HIV: hep c neg 2017 Labs: no   reports that she has never smoked. She has never used smokeless tobacco. She reports that she does not drink alcohol or use drugs.  Past Medical History:  Diagnosis Date  . Abnormal Pap smear    f/u pap only  . STD (sexually transmitted disease)    HSV 2    Past Surgical History:  Procedure Laterality Date  . COLONOSCOPY  2014    Current Outpatient Medications  Medication Sig Dispense Refill  . cholecalciferol (VITAMIN D) 1000 units tablet Take 1,000 Units as needed by mouth.     Marland Kitchen ibuprofen (ADVIL,MOTRIN) 800 MG tablet      No current facility-administered medications for this visit.     Family History  Problem Relation Age of Onset  . Other Father        extra digits  . Other Sister        extra digits  . Diabetes Brother     ROS:  Pertinent items are  noted in HPI.  Otherwise, a comprehensive ROS was negative.  Exam:   BP 120/80   Pulse 68   Resp 16   Ht 5' 3.75" (1.619 m)   Wt 151 lb (68.5 kg)   LMP 05/23/2012   BMI 26.12 kg/m  Height: 5' 3.75" (161.9 cm) Ht Readings from Last 3 Encounters:  02/23/18 5' 3.75" (1.619 m)  02/17/17 5' 3.5" (1.613 m)  02/12/16 5' 3.75" (1.619 m)    General appearance: alert, cooperative and appears stated age Head: Normocephalic, without obvious abnormality, atraumatic Neck: no adenopathy, supple, symmetrical, trachea midline and thyroid normal to inspection and palpation Lungs: clear to auscultation bilaterally Breasts: normal appearance, no masses or tenderness, No nipple retraction or dimpling, No nipple discharge or bleeding, No axillary or supraclavicular adenopathy Heart: regular rate and rhythm Abdomen: soft, non-tender; no masses,  no organomegaly Extremities: extremities normal, atraumatic, no cyanosis or edema Skin: Skin color, texture, turgor normal. No rashes or lesions Lymph nodes: Cervical, supraclavicular, and axillary nodes normal. No abnormal inguinal nodes palpated Neurologic: Grossly normal   Pelvic: External genitalia:  no lesions              Urethra:  normal appearing urethra with no masses, tenderness or lesions              Bartholin's  and Skene's: normal                 Vagina: normal appearing vagina with normal color and discharge, no lesions              Cervix: no cervical motion tenderness and no lesions              Pap taken: Yes.   Bimanual Exam:  Uterus:  normal size, contour, position, consistency, mobility, non-tender              Adnexa: normal adnexa and no mass, fullness, tenderness               Rectovaginal: Confirms               Anus:  normal sphincter tone, no lesions  Chaperone present: yes  A:  Well Woman with normal exam  Post menopausal no HRT.  Bone density due will schedule in 2020  P:   Reviewed health and wellness pertinent to  exam  Aware of need to advise if vaginal bleeding.  Patient will call so order can be faxed when she schedules  Pap smear: yes   counseled on breast self exam, mammography screening, adequate intake of calcium and vitamin D, diet and exercise  return annually or prn  An After Visit Summary was printed and given to the patient.

## 2018-02-25 ENCOUNTER — Ambulatory Visit: Payer: 59 | Admitting: Certified Nurse Midwife

## 2018-02-25 LAB — CYTOLOGY - PAP
Diagnosis: NEGATIVE
HPV: NOT DETECTED

## 2019-03-07 ENCOUNTER — Ambulatory Visit: Payer: BLUE CROSS/BLUE SHIELD | Admitting: Certified Nurse Midwife

## 2019-06-27 ENCOUNTER — Encounter: Payer: Self-pay | Admitting: Certified Nurse Midwife

## 2020-02-26 ENCOUNTER — Ambulatory Visit: Payer: Self-pay | Admitting: Certified Nurse Midwife

## 2021-04-28 NOTE — Progress Notes (Signed)
° °  Claire Mcdowell 21-Aug-1955 [id number]   History:  66 y.o. [id number] presents for annual exam without GYN complaints. Postmenopausal - no HRT, no bleeding. Abnormal pap many years ago, no intervention required.   Gynecologic History Patient's last menstrual period was 05/23/2012.   Contraception/Family planning: post menopausal status Sexually active: No  Health Maintenance Last Pap: 02/23/2018. Results were: Normal, 5-year repeat Last mammogram: 04/18/2020. Results were: Normal Last colonoscopy: 2014. Results were: Normal, 10-year recall Last Dexa: Never  Past medical history, past surgical history, family history and social history were all reviewed and documented in the EPIC chart. Married. Works at [employer]. 3 children, 2 are local and one in [place]. Grandchildren ages 1-13.   ROS:  A ROS was performed and pertinent positives and negatives are included.  Exam:  Vitals:   04/29/21 0816  BP: 134/84  Weight: 154 lb (69.9 kg)  Height: 5' 3.5" (1.613 m)   Body mass index is 26.85 kg/m.  General appearance:  Normal Thyroid:  Symmetrical, normal in size, without palpable masses or nodularity. Respiratory  Auscultation:  Clear without wheezing or rhonchi Cardiovascular  Auscultation:  Regular rate, without rubs, murmurs or gallops  Edema/varicosities:  Not grossly evident Abdominal  Soft,nontender, without masses, guarding or rebound.  Liver/spleen:  No organomegaly noted  Hernia:  None appreciated  Skin  Inspection:  Grossly normal Breasts: Examined lying and sitting.   Right: Without masses, retractions, nipple discharge or axillary adenopathy.   Left: Without masses, retractions, nipple discharge or axillary adenopathy. Genitourinary   Inguinal/mons:  Normal without inguinal adenopathy  External genitalia:  Normal appearing vulva with no masses, tenderness, or lesions  BUS/Urethra/Skene's glands:  Normal  Vagina:  Normal appearing with normal color and discharge, no  lesions. Atrophic changes.   Cervix:  Normal appearing without discharge or lesions  Uterus:  Normal in size, shape and contour.  Midline and mobile, nontender  Adnexa/parametria:     Rt: Normal in size, without masses or tenderness.   Lt: Normal in size, without masses or tenderness.  Anus and perineum: Normal  Digital rectal exam: Normal sphincter tone without palpated masses or tenderness  Patient informed chaperone available to be present for breast and pelvic exam. Patient has requested no chaperone to be present. Patient has been advised what will be completed during breast and pelvic exam.   Assessment/Plan:  66 y.o. [id number] for annual exam.   Well female exam with routine gynecological exam - Plan: CBC with Differential/Platelet, Comprehensive metabolic panel, Lipid panel, TSH. Education provided on SBEs, importance of preventative screenings, current guidelines, high calcium diet, regular exercise, and multivitamin daily. Does not currently have PCP, plans to find new one soon.   Postmenopausal - no HRT, no bleeding.   Screening for cervical cancer - Abnormal pap many years ago, no intervention required. Will repeat at 5-year interval per guidelines.  Screening for breast cancer - Normal mammogram history.  Continue annual screenings. Scheduled 05/08/2021. Normal breast exam today.  Screening for colon cancer - 2014 colonoscopy. Will repeat at 10-year interval per GI's recommendation.   Screening for osteoporosis - Average risk. Will plan for DXA at age 10.   Return in 1 year for annual.     Olivia Mackie DNP, 8:38 AM 04/29/2021

## 2021-04-29 ENCOUNTER — Other Ambulatory Visit: Payer: Self-pay

## 2021-04-29 ENCOUNTER — Ambulatory Visit (INDEPENDENT_AMBULATORY_CARE_PROVIDER_SITE_OTHER): Payer: 59 | Admitting: Nurse Practitioner

## 2021-04-29 ENCOUNTER — Encounter: Payer: Self-pay | Admitting: Nurse Practitioner

## 2021-04-29 VITALS — BP 134/84 | Ht 63.5 in | Wt 154.0 lb

## 2021-04-29 DIAGNOSIS — Z01419 Encounter for gynecological examination (general) (routine) without abnormal findings: Secondary | ICD-10-CM

## 2021-04-29 DIAGNOSIS — Z780 Asymptomatic menopausal state: Secondary | ICD-10-CM | POA: Diagnosis not present

## 2021-04-29 LAB — COMPREHENSIVE METABOLIC PANEL
AG Ratio: 1.6 (calc) (ref 1.0–2.5)
ALT: 17 U/L (ref 6–29)
AST: 17 U/L (ref 10–35)
Albumin: 4.3 g/dL (ref 3.6–5.1)
Alkaline phosphatase (APISO): 76 U/L (ref 37–153)
BUN: 17 mg/dL (ref 7–25)
CO2: 30 mmol/L (ref 20–32)
Calcium: 9.2 mg/dL (ref 8.6–10.4)
Chloride: 107 mmol/L (ref 98–110)
Creat: 0.76 mg/dL (ref 0.50–1.05)
Globulin: 2.7 g/dL (calc) (ref 1.9–3.7)
Glucose, Bld: 92 mg/dL (ref 65–99)
Potassium: 4.5 mmol/L (ref 3.5–5.3)
Sodium: 141 mmol/L (ref 135–146)
Total Bilirubin: 0.4 mg/dL (ref 0.2–1.2)
Total Protein: 7 g/dL (ref 6.1–8.1)

## 2021-04-29 LAB — CBC WITH DIFFERENTIAL/PLATELET
Absolute Monocytes: 323 cells/uL (ref 200–950)
Basophils Absolute: 20 cells/uL (ref 0–200)
Basophils Relative: 0.6 %
Eosinophils Absolute: 61 cells/uL (ref 15–500)
Eosinophils Relative: 1.8 %
HCT: 36.2 % (ref 35.0–45.0)
Hemoglobin: 12.2 g/dL (ref 11.7–15.5)
Lymphs Abs: 1044 cells/uL (ref 850–3900)
MCH: 29.3 pg (ref 27.0–33.0)
MCHC: 33.7 g/dL (ref 32.0–36.0)
MCV: 87 fL (ref 80.0–100.0)
MPV: 10.3 fL (ref 7.5–12.5)
Monocytes Relative: 9.5 %
Neutro Abs: 1952 cells/uL (ref 1500–7800)
Neutrophils Relative %: 57.4 %
Platelets: 229 10*3/uL (ref 140–400)
RBC: 4.16 10*6/uL (ref 3.80–5.10)
RDW: 13.3 % (ref 11.0–15.0)
Total Lymphocyte: 30.7 %
WBC: 3.4 10*3/uL — ABNORMAL LOW (ref 3.8–10.8)

## 2021-04-29 LAB — TSH: TSH: 3 mIU/L (ref 0.40–4.50)

## 2021-04-29 LAB — LIPID PANEL
Cholesterol: 184 mg/dL (ref <200)
HDL: 60 mg/dL (ref >=50)
LDL Cholesterol (Calc): 111 mg/dL (calc) — ABNORMAL HIGH
Non-HDL Cholesterol (Calc): 124 mg/dL (calc) (ref <130)
Total CHOL/HDL Ratio: 3.1 (calc) (ref <5.0)
Triglycerides: 44 mg/dL (ref <150)

## 2021-05-13 ENCOUNTER — Encounter: Payer: Self-pay | Admitting: Nurse Practitioner

## 2022-04-30 ENCOUNTER — Ambulatory Visit: Payer: 59 | Admitting: Nurse Practitioner

## 2022-04-30 NOTE — Progress Notes (Deleted)
   Claire Mcdowell [birth year] [id number]   History:  67 y.o. [id number] presents for annual exam without GYN complaints. Postmenopausal - no HRT, no bleeding. Abnormal pap many years ago, no intervention required.   Gynecologic History Patient's last menstrual period was 05/23/2012.   Contraception/Family planning: post menopausal status Sexually active: No  Health Maintenance Last Pap: 02/23/2018. Results were: Normal neg HPV, 5-year repeat Last mammogram: 05/08/2021. Results were: Normal Last colonoscopy: 2014. Results were: Normal, 10-year recall Last Dexa: Never  Past medical history, past surgical history, family history and social history were all reviewed and documented in the EPIC chart. Married. Works at [employer]. 3 children, 2 are local and one in [place]. Grandchildren ages 86-13.   ROS:  A ROS was performed and pertinent positives and negatives are included.  Exam:  There were no vitals filed for this visit.  There is no height or weight on file to calculate BMI.  General appearance:  Normal Thyroid:  Symmetrical, normal in size, without palpable masses or nodularity. Respiratory  Auscultation:  Clear without wheezing or rhonchi Cardiovascular  Auscultation:  Regular rate, without rubs, murmurs or gallops  Edema/varicosities:  Not grossly evident Abdominal  Soft,nontender, without masses, guarding or rebound.  Liver/spleen:  No organomegaly noted  Hernia:  None appreciated  Skin  Inspection:  Grossly normal Breasts: Examined lying and sitting.   Right: Without masses, retractions, nipple discharge or axillary adenopathy.   Left: Without masses, retractions, nipple discharge or axillary adenopathy. Genitourinary   Inguinal/mons:  Normal without inguinal adenopathy  External genitalia:  Normal appearing vulva with no masses, tenderness, or lesions  BUS/Urethra/Skene's glands:  Normal  Vagina:  Normal appearing with normal color and discharge, no lesions. Atrophic changes.    Cervix:  Normal appearing without discharge or lesions  Uterus:  Normal in size, shape and contour.  Midline and mobile, nontender  Adnexa/parametria:     Rt: Normal in size, without masses or tenderness.   Lt: Normal in size, without masses or tenderness.  Anus and perineum: Normal  Digital rectal exam: Not indicated  Patient informed chaperone available to be present for breast and pelvic exam. Patient has requested no chaperone to be present. Patient has been advised what will be completed during breast and pelvic exam.   Assessment/Plan:  67 y.o. [id number] for annual exam.   Well female exam with routine gynecological exam - Plan: CBC with Differential/Platelet, Comprehensive metabolic panel, Lipid panel, TSH. Education provided on SBEs, importance of preventative screenings, current guidelines, high calcium diet, regular exercise, and multivitamin daily. Does not currently have PCP, plans to find new one soon.   Postmenopausal - no HRT, no bleeding.   Screening for cervical cancer - Abnormal pap many years ago, no intervention required. Will repeat at 5-year interval per guidelines.  Screening for breast cancer - Normal mammogram history.  Continue annual screenings. Scheduled 05/08/2021. Normal breast exam today.  Screening for colon cancer - 2014 colonoscopy. Will repeat at 10-year interval per GI's recommendation.   Screening for osteoporosis - Average risk. Will plan for DXA at age 89.   Return in 1 year for annual.     Tamela Gammon DNP, 7:52 AM 04/30/2022

## 2022-05-15 ENCOUNTER — Encounter: Payer: Self-pay | Admitting: Nurse Practitioner

## 2022-05-20 ENCOUNTER — Ambulatory Visit (INDEPENDENT_AMBULATORY_CARE_PROVIDER_SITE_OTHER): Payer: 59 | Admitting: Nurse Practitioner

## 2022-05-20 ENCOUNTER — Encounter: Payer: Self-pay | Admitting: Nurse Practitioner

## 2022-05-20 VITALS — BP 134/80 | Ht 63.5 in | Wt 151.0 lb

## 2022-05-20 DIAGNOSIS — E7800 Pure hypercholesterolemia, unspecified: Secondary | ICD-10-CM

## 2022-05-20 DIAGNOSIS — Z01419 Encounter for gynecological examination (general) (routine) without abnormal findings: Secondary | ICD-10-CM

## 2022-05-20 DIAGNOSIS — Z780 Asymptomatic menopausal state: Secondary | ICD-10-CM | POA: Diagnosis not present

## 2022-05-20 NOTE — Progress Notes (Signed)
   Claire Mcdowell 1955-09-17 [id number]   History:  67 y.o. [id number] presents for annual exam without GYN complaints. Postmenopausal - no HRT, no bleeding. Abnormal pap many years ago, no intervention required.   Gynecologic History Patient's last menstrual period was 05/23/2012.   Contraception/Family planning: post menopausal status Sexually active: No  Health Maintenance Last Pap: 02/23/2018. Results were: Normal neg HPV, 5-year repeat Last mammogram: 05/14/2022. Results were: Normal Last colonoscopy: 2014. Results were: Normal, 10-year recall Last Dexa: Never  Past medical history, past surgical history, family history and social history were all reviewed and documented in the EPIC chart. Married. Works at [employer]. 3 children, son an daughter are local and one son in [place]. 5 grandchildren.    ROS:  A ROS was performed and pertinent positives and negatives are included.  Exam:  Vitals:   05/20/22 1138  BP: 134/80  Weight: 151 lb (68.5 kg)  Height: 5' 3.5" (1.613 m)    Body mass index is 26.33 kg/m.  General appearance:  Normal Thyroid:  Symmetrical, normal in size, without palpable masses or nodularity. Respiratory  Auscultation:  Clear without wheezing or rhonchi Cardiovascular  Auscultation:  Regular rate, without rubs, murmurs or gallops  Edema/varicosities:  Not grossly evident Abdominal  Soft,nontender, without masses, guarding or rebound.  Liver/spleen:  No organomegaly noted  Hernia:  None appreciated  Skin  Inspection:  Grossly normal Breasts: Examined lying and sitting.   Right: Without masses, retractions, nipple discharge or axillary adenopathy.   Left: Without masses, retractions, nipple discharge or axillary adenopathy. Genitourinary   Inguinal/mons:  Normal without inguinal adenopathy  External genitalia:  Normal appearing vulva with no masses, tenderness, or lesions  BUS/Urethra/Skene's glands:  Normal  Vagina:  Normal appearing with normal color and  discharge, no lesions. Atrophic changes  Cervix:  Normal appearing without discharge or lesions  Uterus:  Normal in size, shape and contour.  Midline and mobile, nontender  Adnexa/parametria:     Rt: Normal in size, without masses or tenderness.   Lt: Normal in size, without masses or tenderness.  Anus and perineum: Normal  Digital rectal exam: Not indicated  Patient informed chaperone available to be present for breast and pelvic exam. Patient has requested no chaperone to be present. Patient has been advised what will be completed during breast and pelvic exam.   Assessment/Plan:  67 y.o. [id number] for annual exam.   Well female exam with routine gynecological exam - Plan: CBC with Differential/Platelet, Comprehensive metabolic panel. Education provided on SBEs, importance of preventative screenings, current guidelines, high calcium diet, regular exercise, and multivitamin daily.  Postmenopausal - no HRT, no bleeding  Elevated LDL cholesterol level - Plan: Lipid panel  Screening for cervical cancer - Abnormal pap many years ago, no intervention required. Will repeat at 5-year interval per guidelines.  Screening for breast cancer - Normal mammogram history.  Continue annual screenings. Normal breast exam today.  Screening for colon cancer - 2014 colonoscopy. Due this year. Has consultation scheduled with GI.  Screening for osteoporosis - Average risk. Will plan for DXA at age 89.   Return in 1 year for annual.     Claire Gammon DNP, 11:44 AM 05/20/2022

## 2022-05-21 ENCOUNTER — Other Ambulatory Visit: Payer: Self-pay | Admitting: Nurse Practitioner

## 2022-05-21 DIAGNOSIS — D72819 Decreased white blood cell count, unspecified: Secondary | ICD-10-CM

## 2022-05-21 LAB — LIPID PANEL
Cholesterol: 177 mg/dL (ref <200)
HDL: 58 mg/dL (ref >=50)
LDL Cholesterol (Calc): 105 mg/dL (calc) — ABNORMAL HIGH
Non-HDL Cholesterol (Calc): 119 mg/dL (calc) (ref <130)
Total CHOL/HDL Ratio: 3.1 (calc) (ref <5.0)
Triglycerides: 49 mg/dL (ref <150)

## 2022-05-21 LAB — CBC WITH DIFFERENTIAL/PLATELET
Absolute Monocytes: 213 cells/uL (ref 200–950)
Basophils Absolute: 20 cells/uL (ref 0–200)
Basophils Relative: 0.7 %
Eosinophils Absolute: 31 cells/uL (ref 15–500)
Eosinophils Relative: 1.1 %
HCT: 35.5 % (ref 35.0–45.0)
Hemoglobin: 12 g/dL (ref 11.7–15.5)
Lymphs Abs: 1000 cells/uL (ref 850–3900)
MCH: 29.4 pg (ref 27.0–33.0)
MCHC: 33.8 g/dL (ref 32.0–36.0)
MCV: 87 fL (ref 80.0–100.0)
MPV: 10.2 fL (ref 7.5–12.5)
Monocytes Relative: 7.6 %
Neutro Abs: 1537 cells/uL (ref 1500–7800)
Neutrophils Relative %: 54.9 %
Platelets: 218 10*3/uL (ref 140–400)
RBC: 4.08 10*6/uL (ref 3.80–5.10)
RDW: 13.3 % (ref 11.0–15.0)
Total Lymphocyte: 35.7 %
WBC: 2.8 10*3/uL — ABNORMAL LOW (ref 3.8–10.8)

## 2022-05-21 LAB — COMPREHENSIVE METABOLIC PANEL
AG Ratio: 1.5 (calc) (ref 1.0–2.5)
ALT: 12 U/L (ref 6–29)
AST: 15 U/L (ref 10–35)
Albumin: 4.1 g/dL (ref 3.6–5.1)
Alkaline phosphatase (APISO): 82 U/L (ref 37–153)
BUN: 19 mg/dL (ref 7–25)
CO2: 25 mmol/L (ref 20–32)
Calcium: 8.9 mg/dL (ref 8.6–10.4)
Chloride: 107 mmol/L (ref 98–110)
Creat: 0.8 mg/dL (ref 0.50–1.05)
Globulin: 2.8 g/dL (calc) (ref 1.9–3.7)
Glucose, Bld: 87 mg/dL (ref 65–99)
Potassium: 4 mmol/L (ref 3.5–5.3)
Sodium: 140 mmol/L (ref 135–146)
Total Bilirubin: 0.4 mg/dL (ref 0.2–1.2)
Total Protein: 6.9 g/dL (ref 6.1–8.1)

## 2022-07-24 LAB — HM COLONOSCOPY

## 2023-05-24 ENCOUNTER — Encounter: Payer: Self-pay | Admitting: Nurse Practitioner

## 2024-02-23 ENCOUNTER — Ambulatory Visit: Admitting: Nurse Practitioner

## 2024-02-23 NOTE — Progress Notes (Deleted)
   Claire Mcdowell [birth year] [id number]   History:  68 y.o. [id number] presents for annual exam without GYN complaints. Postmenopausal - no HRT, no bleeding. Abnormal pap many years ago, no intervention required.   Gynecologic History Patient's last menstrual period was 05/23/2012.   Contraception/Family planning: post menopausal status Sexually active: No  Health Maintenance Last Pap: 02/23/2018. Results were: Normal neg HPV Last mammogram: 05/20/2023. Results were: Normal Last colonoscopy: 08/17/2022. Results were: Normal, 10-year recall Last Dexa: Never      No data to display           Past medical history, past surgical history, family history and social history were all reviewed and documented in the EPIC chart. Married. Works at [employer]. 3 children, son an daughter are local and one son in [place]. 5 grandchildren.    ROS:  A ROS was performed and pertinent positives and negatives are included.  Exam:  There were no vitals filed for this visit.   There is no height or weight on file to calculate BMI.  General appearance:  Normal Thyroid:  Symmetrical, normal in size, without palpable masses or nodularity. Respiratory  Auscultation:  Clear without wheezing or rhonchi Cardiovascular  Auscultation:  Regular rate, without rubs, murmurs or gallops  Edema/varicosities:  Not grossly evident Abdominal  Soft,nontender, without masses, guarding or rebound.  Liver/spleen:  No organomegaly noted  Hernia:  None appreciated  Skin  Inspection:  Grossly normal Breasts: Examined lying and sitting.   Right: Without masses, retractions, nipple discharge or axillary adenopathy.   Left: Without masses, retractions, nipple discharge or axillary adenopathy. Pelvic: External genitalia:  no lesions              Urethra:  normal appearing urethra with no masses, tenderness or lesions              Bartholins and Skenes: normal                 Vagina: normal appearing vagina with normal color  and discharge, no lesions              Cervix: no lesions Bimanual Exam:  Uterus:  no masses or tenderness              Adnexa: no mass, fullness, tenderness              Rectovaginal: Deferred              Anus:  normal, no lesions   Assessment/Plan:  68 y.o. [id number] for annual exam.   Well female exam with routine gynecological exam - Plan: CBC with Differential/Platelet, Comprehensive metabolic panel. Education provided on SBEs, importance of preventative screenings, current guidelines, high calcium diet, regular exercise, and multivitamin daily.  Postmenopausal - no HRT, no bleeding  Elevated LDL cholesterol level - Plan: Lipid panel  Screening for cervical cancer - Abnormal pap many years ago, no intervention required. Pap today. If normal can stop screenings.   Screening for breast cancer - Normal mammogram history.  Continue annual screenings. Normal breast exam today.  Screening for colon cancer - 08/2022 colonoscopy.   Screening for osteoporosis - Average risk. Will plan for DXA at age 89.   No follow-ups on file.     Claire DELENA Shutter DNP, 9:21 AM 02/23/2024

## 2024-05-10 ENCOUNTER — Encounter: Payer: Self-pay | Admitting: Nurse Practitioner

## 2024-05-10 ENCOUNTER — Ambulatory Visit: Admitting: Nurse Practitioner

## 2024-05-10 VITALS — BP 122/64 | HR 79 | Ht 62.75 in | Wt 156.0 lb

## 2024-05-10 DIAGNOSIS — Z124 Encounter for screening for malignant neoplasm of cervix: Secondary | ICD-10-CM

## 2024-05-10 DIAGNOSIS — Z01419 Encounter for gynecological examination (general) (routine) without abnormal findings: Secondary | ICD-10-CM

## 2024-05-10 DIAGNOSIS — Z780 Asymptomatic menopausal state: Secondary | ICD-10-CM

## 2024-05-10 NOTE — Progress Notes (Signed)
" ° °  Denean Pavon Nov 04, 1955 [id number]   History:  69 y.o. [id number] presents for annual exam without GYN complaints. Postmenopausal - no HRT, no bleeding. Abnormal pap many years ago, no intervention required.   Gynecologic History Patient's last menstrual period was 05/23/2012.   Contraception/Family planning: post menopausal status Sexually active: No  Health Maintenance Last Pap: 02/23/2018. Results were: Normal neg HPV Last mammogram: 05/20/2023. Results were: Normal Last colonoscopy: 08/17/2022. Results were: Tubular adenoma, 5-year recall Last Dexa: 02/23/2023. Results were: T-score -1.1  Past medical history, past surgical history, family history and social history were all reviewed and documented in the EPIC chart. Married. Works at [employer]. 3 children, son an daughter are local and one son in [place]. 5 grandchildren.    ROS:  A ROS was performed and pertinent positives and negatives are included.  Exam:  Vitals:   05/10/24 1522  BP: 122/64  Pulse: 79  SpO2: 97%  Weight: 156 lb (70.8 kg)  Height: 5' 2.75 (1.594 m)     Body mass index is 27.85 kg/m.  General appearance:  Normal Thyroid:  Symmetrical, normal in size, without palpable masses or nodularity. Respiratory  Auscultation:  Clear without wheezing or rhonchi Cardiovascular  Auscultation:  Regular rate, without rubs, murmurs or gallops  Edema/varicosities:  Not grossly evident Abdominal  Soft,nontender, without masses, guarding or rebound.  Liver/spleen:  No organomegaly noted  Hernia:  None appreciated  Skin  Inspection:  Grossly normal Breasts: Examined lying and sitting.   Right: Without masses, retractions, nipple discharge or axillary adenopathy.   Left: Without masses, retractions, nipple discharge or axillary adenopathy. Pelvic: External genitalia:  no lesions              Urethra:  normal appearing urethra with no masses, tenderness or lesions              Bartholins and Skenes: normal                  Vagina: normal appearing vagina with normal color and discharge, no lesions. Atrophic changes              Cervix: no lesions Bimanual Exam:  Uterus:  no masses or tenderness              Adnexa: no mass, fullness, tenderness              Rectovaginal: Deferred              Anus:  normal, no lesions  Clotilda Pa, CMA present as chaperone.   Assessment/Plan:  69 y.o. [id number] for annual exam.   Well female exam with routine gynecological exam - Education provided on SBEs, importance of preventative screenings, current guidelines, high calcium diet, regular exercise, and multivitamin daily. Labs with PCP.   Postmenopausal - no HRT, no bleeding  Cervical cancer screening - Plan: Cytology - PAP( [facility]). Abnormal pap many years ago, no intervention required. Discussed option to stop screening if normal per guidelines and patient agreeable.   Screening for breast cancer - Normal mammogram history.  Continue annual screenings. Normal breast exam today.  Screening for colon cancer - 2024 colonoscopy. Will repeat at GI's recommended interval.   Screening for osteoporosis - 02/2023 T-score -1.1. Continue Vit D + Calcium and increase exercise.   Return in about 1 year (around 05/10/2025) for Annual.     Annabella DELENA Shutter DNP, 3:47 PM 05/10/2024 "

## 2024-05-11 LAB — CYTOLOGY - PAP
Comment: NEGATIVE
Diagnosis: NEGATIVE
High risk HPV: NEGATIVE

## 2024-05-12 ENCOUNTER — Ambulatory Visit: Payer: Self-pay | Admitting: Nurse Practitioner
